# Patient Record
Sex: Female | Born: 1990 | Race: Black or African American | Hispanic: No | Marital: Single | Smoking: Never smoker
Health system: Southern US, Community
[De-identification: ages and names within clinical notes are randomized; demographics above are authoritative.]

## PROBLEM LIST (undated history)

## (undated) DIAGNOSIS — Z789 Other specified health status: Secondary | ICD-10-CM

## (undated) HISTORY — PX: NO PAST SURGERIES: SHX2092

---

## 2008-01-08 ENCOUNTER — Inpatient Hospital Stay (HOSPITAL_COMMUNITY): Admission: AD | Admit: 2008-01-08 | Discharge: 2008-01-08 | Payer: Self-pay | Admitting: Family Medicine

## 2008-01-10 ENCOUNTER — Inpatient Hospital Stay (HOSPITAL_COMMUNITY): Admission: AD | Admit: 2008-01-10 | Discharge: 2008-01-10 | Payer: Self-pay | Admitting: Gynecology

## 2008-01-17 ENCOUNTER — Inpatient Hospital Stay (HOSPITAL_COMMUNITY): Admission: AD | Admit: 2008-01-17 | Discharge: 2008-01-17 | Payer: Self-pay | Admitting: Obstetrics and Gynecology

## 2008-01-27 ENCOUNTER — Inpatient Hospital Stay (HOSPITAL_COMMUNITY): Admission: AD | Admit: 2008-01-27 | Discharge: 2008-01-27 | Payer: Self-pay | Admitting: Obstetrics and Gynecology

## 2010-02-28 IMAGING — US US OB COMP LESS 14 WK
1 series · 14 of 28 positions shown · non-contrast
Comparison: none

CLINICAL DATA: Positive pregnancy test with pelvic pain and vaginal
bleeding.

OBSTETRIC <14 WK US AND TRANSVAGINAL OB US
TECHNIQUE: Both transabdominal and transvaginal ultrasound
examinations were performed for complete evaluation of the
gestation as well as the maternal uterus, adnexal regions, and
pelvic cul-de-sac.

[Series 1: us ob comp less 14 wks · 14 of 43 slices shown]
[im 2/43]
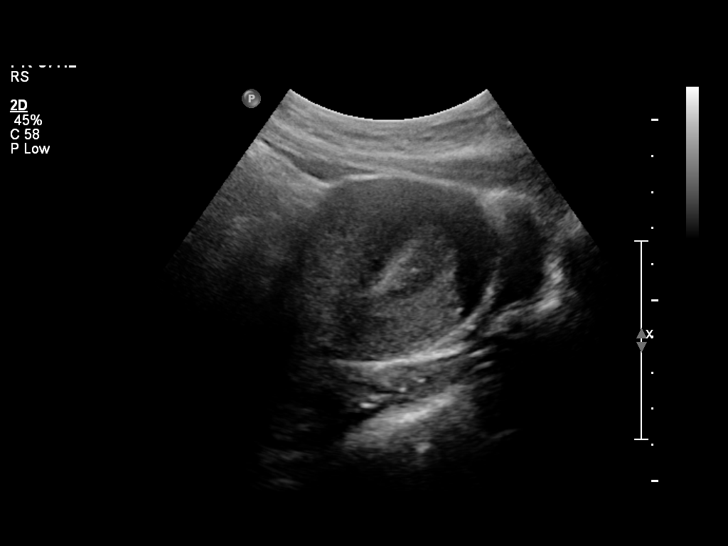
[im 5/43]
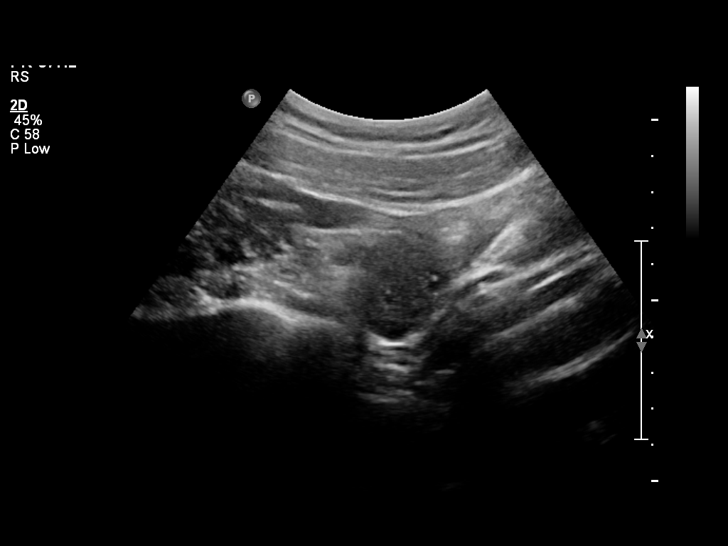
[im 8/43]
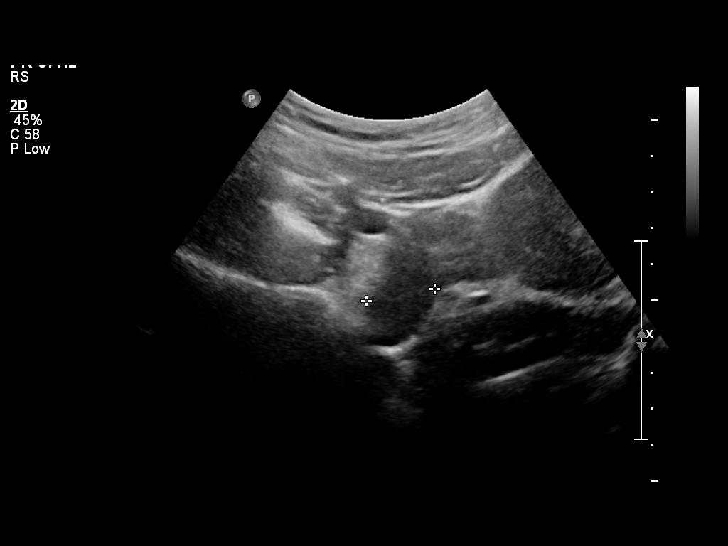
[im 11/43]
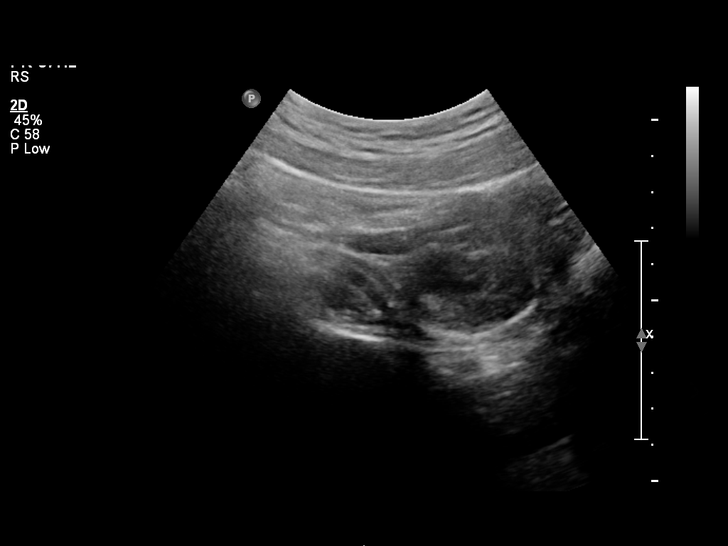
[im 15/43]
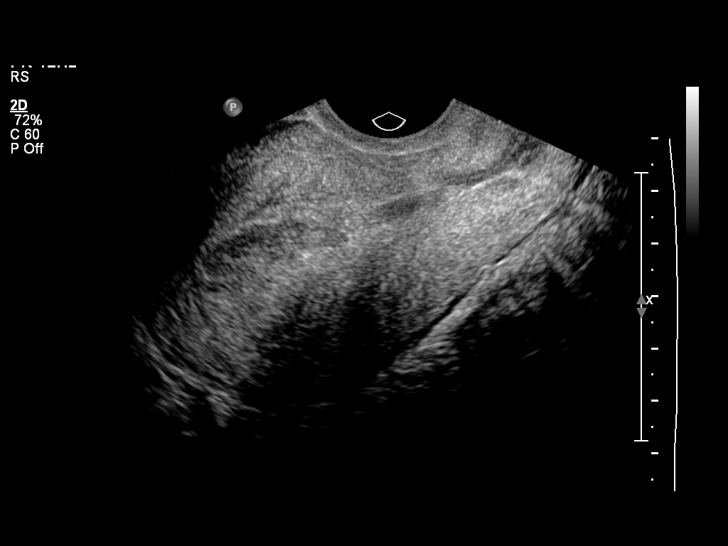
[im 18/43]
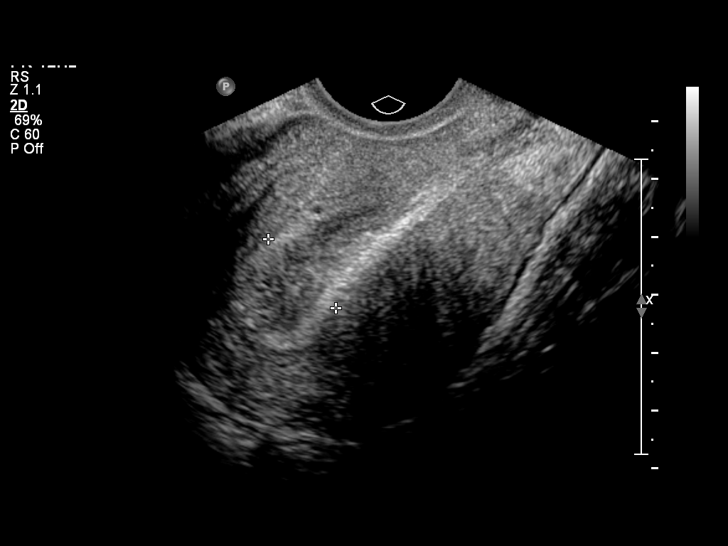
[im 21/43]
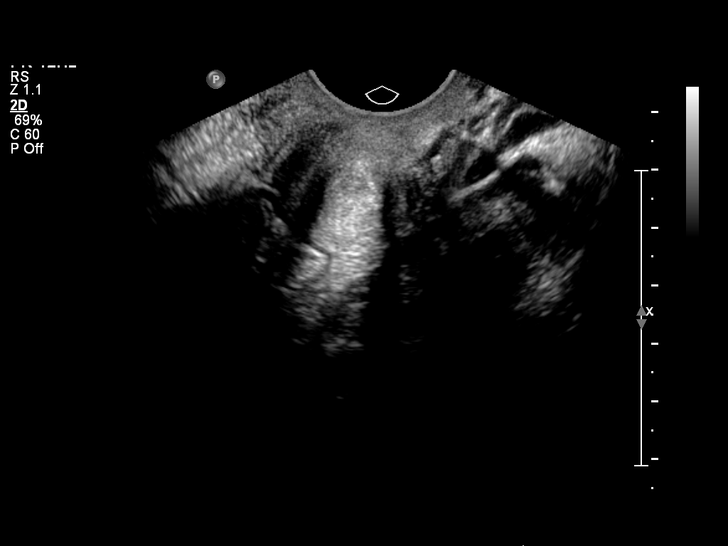
[im 24/43]
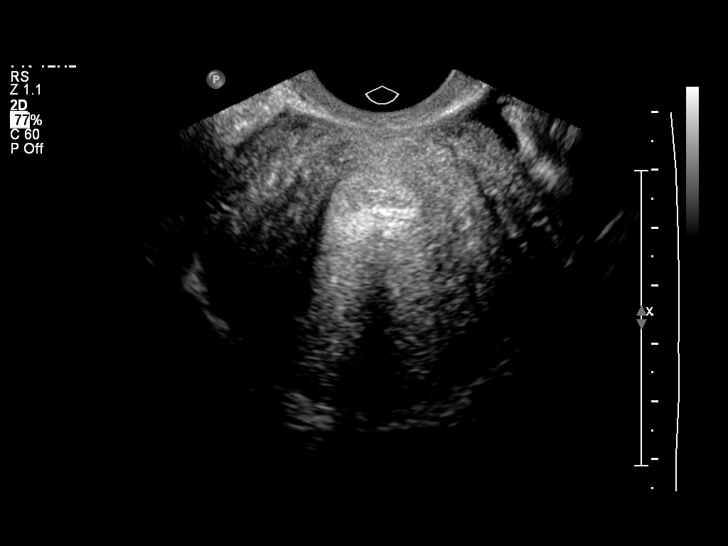
[im 27/43]
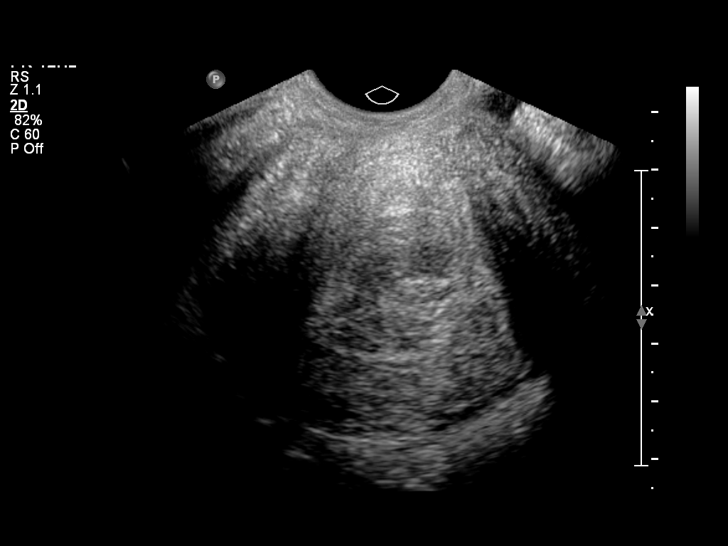
[im 30/43]
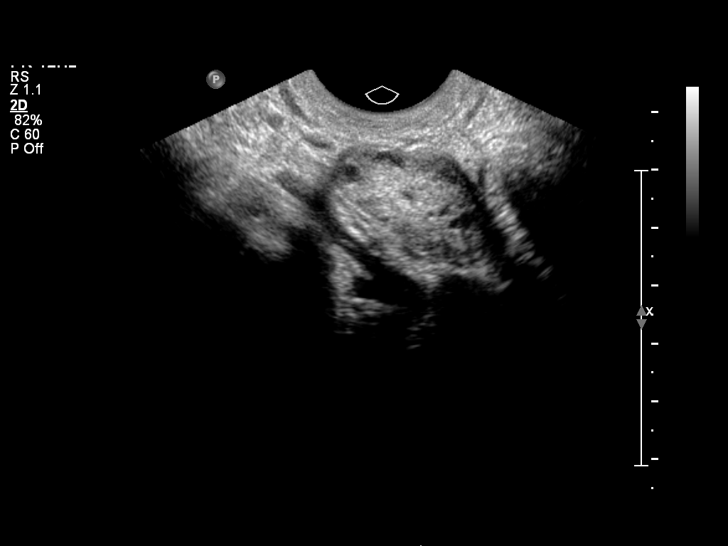
[im 33/43]
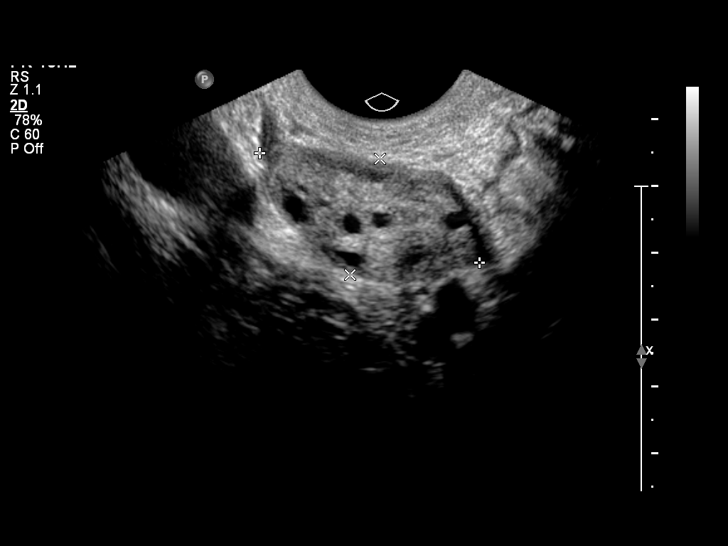
[im 36/43]
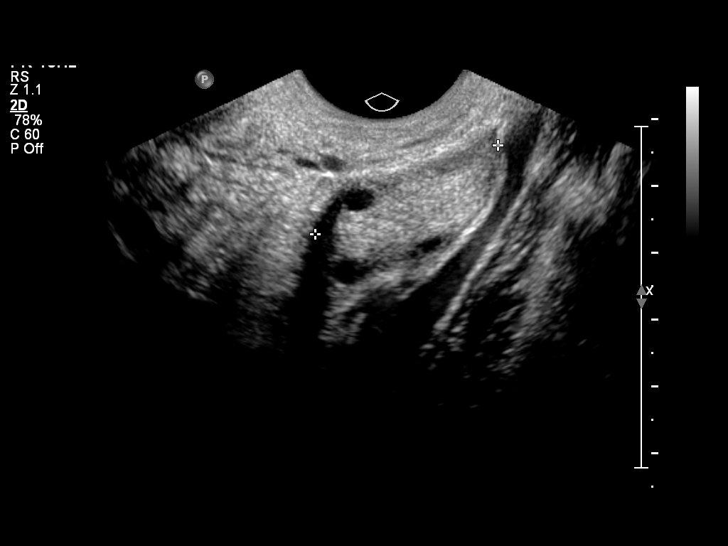
[im 39/43]
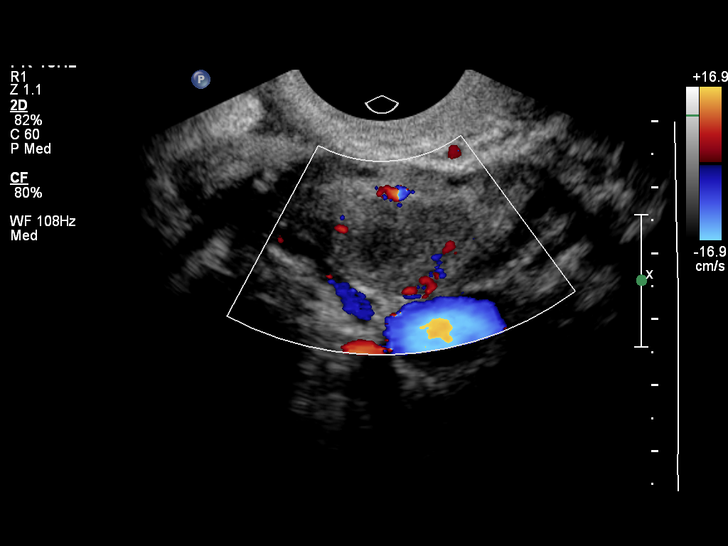
[im 43/43]
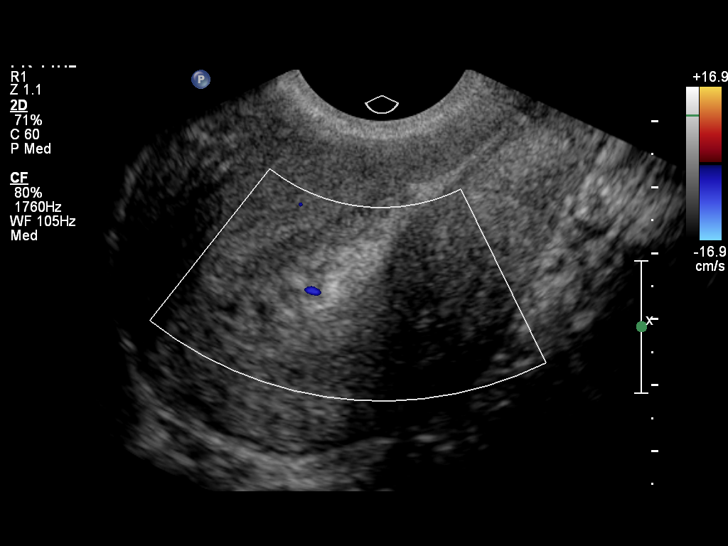

[14 of 28 positions shown; findings below may reference images not displayed]

Intrauterine gestational sac: No
Yolk sac: No
Embryo: No
Cardiac Activity: No

The endometrium is heterogeneously thickened - measuring up to 19
mm in greatest diameter.

The uterus is anteverted measuring 9 x 5.6 x 6.8 cm.
The ovaries bilaterally are unremarkable except for probable left
corpus luteum.
No adnexal masses or free fluid identified.
IMPRESSION: No evidence of intrauterine pregnancy.

Heterogeneously thickened endometrium - may represent products of
conception but clinical or ultrasound follow-up is recommended.

Normal ovaries.  No evidence of adnexal mass or free fluid.

## 2010-12-01 ENCOUNTER — Emergency Department (HOSPITAL_COMMUNITY)
Admission: EM | Admit: 2010-12-01 | Discharge: 2010-12-01 | Disposition: A | Discharge status: Home / Self Care | Payer: BC Managed Care – PPO | Attending: Emergency Medicine | Admitting: Emergency Medicine

## 2010-12-01 DIAGNOSIS — K649 Unspecified hemorrhoids: Secondary | ICD-10-CM | POA: Insufficient documentation

## 2010-12-01 LAB — OCCULT BLOOD, POC DEVICE: Fecal Occult Bld: POSITIVE

## 2011-02-19 ENCOUNTER — Emergency Department: Payer: Self-pay | Admitting: Emergency Medicine

## 2011-07-10 LAB — URINALYSIS, ROUTINE W REFLEX MICROSCOPIC
Glucose, UA: NEGATIVE
Leukocytes, UA: NEGATIVE
Protein, ur: NEGATIVE
pH: 7

## 2011-07-10 LAB — WET PREP, GENITAL: Clue Cells Wet Prep HPF POC: NONE SEEN

## 2011-07-10 LAB — URINE MICROSCOPIC-ADD ON

## 2011-07-10 LAB — CBC
HCT: 36.5
MCHC: 34.8
Platelets: 376
RDW: 14.1

## 2011-07-10 LAB — HCG, QUANTITATIVE, PREGNANCY: hCG, Beta Chain, Quant, S: 168 — ABNORMAL HIGH

## 2011-07-10 LAB — GC/CHLAMYDIA PROBE AMP, GENITAL: GC Probe Amp, Genital: NEGATIVE

## 2011-07-11 LAB — POCT PREGNANCY, URINE: Preg Test, Ur: NEGATIVE

## 2011-07-11 LAB — HCG, QUANTITATIVE, PREGNANCY: hCG, Beta Chain, Quant, S: 7 — ABNORMAL HIGH

## 2011-10-24 ENCOUNTER — Emergency Department (HOSPITAL_COMMUNITY)
Admission: EM | Admit: 2011-10-24 | Discharge: 2011-10-24 | Disposition: A | Discharge status: Home / Self Care | Payer: BC Managed Care – PPO | Attending: Emergency Medicine | Admitting: Emergency Medicine

## 2011-10-24 ENCOUNTER — Encounter: Payer: Self-pay | Admitting: *Deleted

## 2011-10-24 DIAGNOSIS — N898 Other specified noninflammatory disorders of vagina: Secondary | ICD-10-CM | POA: Insufficient documentation

## 2011-10-24 DIAGNOSIS — S3763XA Laceration of uterus, initial encounter: Secondary | ICD-10-CM

## 2011-10-24 DIAGNOSIS — S3760XA Unspecified injury of uterus, initial encounter: Secondary | ICD-10-CM | POA: Insufficient documentation

## 2011-10-24 DIAGNOSIS — IMO0002 Reserved for concepts with insufficient information to code with codable children: Secondary | ICD-10-CM | POA: Insufficient documentation

## 2011-10-24 DIAGNOSIS — N939 Abnormal uterine and vaginal bleeding, unspecified: Secondary | ICD-10-CM

## 2011-10-24 LAB — URINALYSIS, ROUTINE W REFLEX MICROSCOPIC
Bilirubin Urine: NEGATIVE
Ketones, ur: NEGATIVE mg/dL
Protein, ur: NEGATIVE mg/dL
Urobilinogen, UA: 0.2 mg/dL (ref 0.0–1.0)

## 2011-10-24 LAB — URINE MICROSCOPIC-ADD ON

## 2011-10-24 LAB — WET PREP, GENITAL
Trich, Wet Prep: NONE SEEN
Yeast Wet Prep HPF POC: NONE SEEN

## 2011-10-24 NOTE — Discharge Instructions (Signed)

## 2011-10-24 NOTE — ED Provider Notes (Signed)
History     CSN: [id number]  Arrival date & time 10/24/11  1212   First MD Initiated Contact with Patient 10/24/11 1913      Chief Complaint  Patient presents with  . Vaginal Bleeding    pt reports vaginal bleeding after intercourse. denies vaginal discharge or odor. reports normal period in December 12th and abnormal spotting with intercourse since then.     7:59 PM HPI Pt reports vaginal bleeding after intercourse this evening. Denies pain. Still in December similar event occurred. Denies abdominal pain, N/V, vaginal discharge, vaginal pain, n/v, back pain, urinary symptoms, weakness, fatigue or dizziness. Reports last normal menses was in November. Denies h/o abnormal menses. Reports only bleeding a small amount of blood, less then a period.  Patient is a 21 y.o. female presenting with vaginal bleeding.  Vaginal Bleeding This is a new problem. The current episode started today. The problem occurs constantly. The problem has been unchanged. Pertinent negatives include no abdominal pain, chest pain, chills, fever, headaches, nausea, urinary symptoms, vomiting or weakness. The symptoms are aggravated by nothing. She has tried nothing for the symptoms.    History reviewed. No pertinent past medical history.  History reviewed. No pertinent past surgical history.  History reviewed. No pertinent family history.  History  Substance Use Topics  . Smoking status: Never Smoker   . Smokeless tobacco: Not on file  . Alcohol Use: No    OB History    Grav Para Term Preterm Abortions TAB SAB Ect Mult Living                  Review of Systems  Constitutional: Negative for fever and chills.  Respiratory: Negative for shortness of breath.   Cardiovascular: Negative for chest pain.  Gastrointestinal: Negative for nausea, vomiting, abdominal pain, diarrhea and constipation.  Genitourinary: Positive for vaginal bleeding. Negative for dysuria, urgency, frequency, hematuria, flank pain,  vaginal discharge and vaginal pain.  Musculoskeletal: Negative for back pain.  Neurological: Negative for weakness and headaches.  All other systems reviewed and are negative.    Allergies  Review of patient's allergies indicates no known allergies.  Home Medications  No current outpatient prescriptions on file.  BP 111/70  Pulse 78  Temp(Src) 98.9 F (37.2 C) (Oral)  Resp 20  Wt 170 lb (77.111 kg)  SpO2 100%  LMP 09/27/2011  Physical Exam  Vitals reviewed. Constitutional: She is oriented to person, place, and time. Vital signs are normal. She appears well-developed and well-nourished.  HENT:  Head: Normocephalic and atraumatic.  Eyes: Conjunctivae are normal. Pupils are equal, round, and reactive to light.  Neck: Normal range of motion. Neck supple.  Cardiovascular: Normal rate, regular rhythm and normal heart sounds.  Exam reveals no friction rub.   No murmur heard. Pulmonary/Chest: Effort normal and breath sounds normal. She has no wheezes. She has no rhonchi. She has no rales. She exhibits no tenderness.  Abdominal: Soft. Bowel sounds are normal. She exhibits no distension. There is no tenderness. There is no rebound and no guarding.  Genitourinary: Uterus normal. There is no tenderness, lesion or injury on the right labia. There is no tenderness, lesion or injury on the left labia. Cervix exhibits no motion tenderness and no discharge. Right adnexum displays no mass, no tenderness and no fullness. Left adnexum displays no mass, no tenderness and no fullness. No bleeding (blood in vaginal vault) around the vagina. No signs of injury around the vagina. No vaginal discharge found.  Musculoskeletal: Normal range of motion.  Neurological: She is alert and oriented to person, place, and time. Coordination normal.  Skin: Skin is warm and dry. No rash noted. No erythema. No pallor.    ED Course  Procedures  Results for orders placed during the hospital encounter of 10/24/11    URINALYSIS, ROUTINE W REFLEX MICROSCOPIC      Component Value Range   Color, Urine YELLOW  YELLOW    APPearance CLEAR  CLEAR    Specific Gravity, Urine 1.021  1.005 - 1.030    pH 6.5  5.0 - 8.0    Glucose, UA NEGATIVE  NEGATIVE (mg/dL)   Hgb urine dipstick LARGE (*) NEGATIVE    Bilirubin Urine NEGATIVE  NEGATIVE    Ketones, ur NEGATIVE  NEGATIVE (mg/dL)   Protein, ur NEGATIVE  NEGATIVE (mg/dL)   Urobilinogen, UA 0.2  0.0 - 1.0 (mg/dL)   Nitrite NEGATIVE  NEGATIVE    Leukocytes, UA NEGATIVE  NEGATIVE   POCT PREGNANCY, URINE      Component Value Range   Preg Test, Ur NEGATIVE    URINE MICROSCOPIC-ADD ON      Component Value Range   Squamous Epithelial / LPF FEW (*) RARE    RBC / HPF 11-20  <3 (RBC/hpf)   Bacteria, UA FEW (*) RARE    Urine-Other MUCOUS PRESENT    WET PREP, GENITAL      Component Value Range   Yeast, Wet Prep NONE SEEN  NONE SEEN    Trich, Wet Prep NONE SEEN  NONE SEEN    Clue Cells, Wet Prep MODERATE (*) NONE SEEN    WBC, Wet Prep HPF POC FEW (*) NONE SEEN    No results found.   MDM  D/c patient with diagnosis of abnormal vaginal bleeding and cervical laceration. Low suspicion that abnormal bleeding is due to laceration since lac was very small (<1cm). Patient does not reports heacy bleeding, weakness, dizziness or fatigue so I did not wait for Isat chem 8. Pt states she will f/u with her OB, Dr. Waynard Reeds for further evaluation. Advised return to ED for worsening symptoms. Also advised patient to be on pelvic rest for 1 week or until advised otherwise by OB/GYN.      Thomasene Lot, PA-C 10/24/11 2316

## 2011-10-25 NOTE — ED Provider Notes (Signed)
Medical screening examination/treatment/procedure(s) were performed by non-physician practitioner and as supervising physician I was immediately available for consultation/collaboration.  Severino Paolo, MD 10/25/11 1241 

## 2015-08-09 LAB — OB RESULTS CONSOLE GC/CHLAMYDIA
CHLAMYDIA, DNA PROBE: NEGATIVE
Gonorrhea: NEGATIVE

## 2015-09-20 LAB — OB RESULTS CONSOLE RUBELLA ANTIBODY, IGM: Rubella: IMMUNE

## 2015-09-20 LAB — OB RESULTS CONSOLE HIV ANTIBODY (ROUTINE TESTING): HIV: NONREACTIVE

## 2015-09-20 LAB — OB RESULTS CONSOLE HEPATITIS B SURFACE ANTIGEN: Hepatitis B Surface Ag: NEGATIVE

## 2015-09-20 LAB — OB RESULTS CONSOLE ABO/RH: RH TYPE: POSITIVE

## 2015-09-20 LAB — OB RESULTS CONSOLE ANTIBODY SCREEN: ANTIBODY SCREEN: NEGATIVE

## 2015-09-20 LAB — OB RESULTS CONSOLE RPR: RPR: NONREACTIVE

## 2015-10-17 NOTE — L&D Delivery Note (Signed)
Patient was C/C/0 and pushed for7 minutes with epidural.   NSVD  female infant, Apgars 9/9, weight pending.   The patient had a left periurethral and left posterior vaginal mucosal tear repaired with 3.0 vicryl. Fundus was firm. EBL was expected amount. Placenta was delivered intact. Vagina was clear.  Baby was vigorous and doing skin to skin with mother.  Philip AspenALLAHAN, Gearl Kimbrough

## 2016-02-29 LAB — OB RESULTS CONSOLE GBS: STREP GROUP B AG: POSITIVE

## 2016-03-27 ENCOUNTER — Encounter (HOSPITAL_COMMUNITY): Payer: Self-pay

## 2016-03-27 ENCOUNTER — Inpatient Hospital Stay (HOSPITAL_COMMUNITY): Payer: 59 | Admitting: Anesthesiology

## 2016-03-27 ENCOUNTER — Inpatient Hospital Stay (HOSPITAL_COMMUNITY)
Admission: AD | Admit: 2016-03-27 | Discharge: 2016-03-29 | DRG: 775 | Disposition: A | Discharge status: Home / Self Care | Payer: 59 | Source: Ambulatory Visit | Attending: Obstetrics and Gynecology | Admitting: Obstetrics and Gynecology

## 2016-03-27 DIAGNOSIS — O99824 Streptococcus B carrier state complicating childbirth: Principal | ICD-10-CM | POA: Diagnosis present

## 2016-03-27 DIAGNOSIS — Z3A39 39 weeks gestation of pregnancy: Secondary | ICD-10-CM | POA: Diagnosis not present

## 2016-03-27 DIAGNOSIS — O4290 Premature rupture of membranes, unspecified as to length of time between rupture and onset of labor, unspecified weeks of gestation: Secondary | ICD-10-CM | POA: Diagnosis present

## 2016-03-27 HISTORY — DX: Other specified health status: Z78.9

## 2016-03-27 LAB — CBC
HEMATOCRIT: 32 % — AB (ref 36.0–46.0)
HEMOGLOBIN: 11.2 g/dL — AB (ref 12.0–15.0)
MCH: 31.9 pg (ref 26.0–34.0)
MCHC: 35 g/dL (ref 30.0–36.0)
MCV: 91.2 fL (ref 78.0–100.0)
Platelets: 290 10*3/uL (ref 150–400)
RBC: 3.51 MIL/uL — ABNORMAL LOW (ref 3.87–5.11)
RDW: 14.6 % (ref 11.5–15.5)
WBC: 7.7 10*3/uL (ref 4.0–10.5)

## 2016-03-27 LAB — TYPE AND SCREEN
ABO/RH(D): O POS
Antibody Screen: NEGATIVE

## 2016-03-27 LAB — RPR: RPR Ser Ql: NONREACTIVE

## 2016-03-27 LAB — POCT FERN TEST: POCT Fern Test: POSITIVE

## 2016-03-27 MED ORDER — PHENYLEPHRINE 40 MCG/ML (10ML) SYRINGE FOR IV PUSH (FOR BLOOD PRESSURE SUPPORT)
80.0000 ug | PREFILLED_SYRINGE | INTRAVENOUS | Status: DC | PRN
  Filled 2016-03-27: qty 5
  Filled 2016-03-27: qty 10

## 2016-03-27 MED ORDER — LACTATED RINGERS IV SOLN: 500.0000 mL | INTRAVENOUS | Status: DC | PRN

## 2016-03-27 MED ORDER — LACTATED RINGERS IV SOLN: 500.0000 mL | Freq: Once | INTRAVENOUS | Status: DC

## 2016-03-27 MED ORDER — OXYTOCIN BOLUS FROM INFUSION
500.0000 mL | INTRAVENOUS | Status: DC
  Administered 2016-03-27: 500 mL via INTRAVENOUS

## 2016-03-27 MED ORDER — IBUPROFEN 600 MG PO TABS
600.0000 mg | ORAL_TABLET | Freq: Four times a day (QID) | ORAL | Status: DC
  Administered 2016-03-27 – 2016-03-29 (×8): 600 mg via ORAL
  Filled 2016-03-27 (×8): qty 1

## 2016-03-27 MED ORDER — COCONUT OIL OIL: 1.0000 "application " | TOPICAL_OIL | Status: DC | PRN

## 2016-03-27 MED ORDER — ONDANSETRON HCL 4 MG/2ML IJ SOLN: 4.0000 mg | INTRAMUSCULAR | Status: DC | PRN

## 2016-03-27 MED ORDER — SOD CITRATE-CITRIC ACID 500-334 MG/5ML PO SOLN: 30.0000 mL | ORAL | Status: DC | PRN

## 2016-03-27 MED ORDER — PENICILLIN G POTASSIUM 5000000 UNITS IJ SOLR
5.0000 10*6.[IU] | Freq: Once | INTRAVENOUS | Status: AC
  Administered 2016-03-27: 5 10*6.[IU] via INTRAVENOUS
  Filled 2016-03-27: qty 5

## 2016-03-27 MED ORDER — OXYCODONE-ACETAMINOPHEN 5-325 MG PO TABS: 1.0000 | ORAL_TABLET | ORAL | Status: DC | PRN

## 2016-03-27 MED ORDER — FENTANYL 2.5 MCG/ML BUPIVACAINE 1/10 % EPIDURAL INFUSION (WH - ANES)
14.0000 mL/h | INTRAMUSCULAR | Status: DC | PRN
  Administered 2016-03-27: 14 mL/h via EPIDURAL
  Filled 2016-03-27: qty 125

## 2016-03-27 MED ORDER — TETANUS-DIPHTH-ACELL PERTUSSIS 5-2.5-18.5 LF-MCG/0.5 IM SUSP: 0.5000 mL | Freq: Once | INTRAMUSCULAR | Status: DC

## 2016-03-27 MED ORDER — LIDOCAINE HCL (PF) 1 % IJ SOLN
INTRAMUSCULAR | Status: DC | PRN
  Administered 2016-03-27 (×2): 4 mL via EPIDURAL

## 2016-03-27 MED ORDER — WITCH HAZEL-GLYCERIN EX PADS: 1.0000 "application " | MEDICATED_PAD | CUTANEOUS | Status: DC | PRN

## 2016-03-27 MED ORDER — EPHEDRINE 5 MG/ML INJ
10.0000 mg | INTRAVENOUS | Status: DC | PRN
  Filled 2016-03-27: qty 2

## 2016-03-27 MED ORDER — DIPHENHYDRAMINE HCL 25 MG PO CAPS: 25.0000 mg | ORAL_CAPSULE | Freq: Four times a day (QID) | ORAL | Status: DC | PRN

## 2016-03-27 MED ORDER — FLEET ENEMA 7-19 GM/118ML RE ENEM: 1.0000 | ENEMA | RECTAL | Status: DC | PRN

## 2016-03-27 MED ORDER — OXYTOCIN 40 UNITS IN LACTATED RINGERS INFUSION - SIMPLE MED
1.0000 m[IU]/min | INTRAVENOUS | Status: DC
  Administered 2016-03-27: 2 m[IU]/min via INTRAVENOUS

## 2016-03-27 MED ORDER — PRENATAL MULTIVITAMIN CH
1.0000 | ORAL_TABLET | Freq: Every day | ORAL | Status: DC
  Administered 2016-03-28 – 2016-03-29 (×2): 1 via ORAL
  Filled 2016-03-27 (×2): qty 1

## 2016-03-27 MED ORDER — SIMETHICONE 80 MG PO CHEW: 80.0000 mg | CHEWABLE_TABLET | ORAL | Status: DC | PRN

## 2016-03-27 MED ORDER — ACETAMINOPHEN 325 MG PO TABS: 650.0000 mg | ORAL_TABLET | ORAL | Status: DC | PRN

## 2016-03-27 MED ORDER — LACTATED RINGERS IV SOLN
INTRAVENOUS | Status: DC
  Administered 2016-03-27 (×2): via INTRAVENOUS

## 2016-03-27 MED ORDER — PHENYLEPHRINE 40 MCG/ML (10ML) SYRINGE FOR IV PUSH (FOR BLOOD PRESSURE SUPPORT)
80.0000 ug | PREFILLED_SYRINGE | INTRAVENOUS | Status: DC | PRN
  Filled 2016-03-27: qty 5

## 2016-03-27 MED ORDER — DIBUCAINE 1 % RE OINT: 1.0000 "application " | TOPICAL_OINTMENT | RECTAL | Status: DC | PRN

## 2016-03-27 MED ORDER — ONDANSETRON HCL 4 MG PO TABS: 4.0000 mg | ORAL_TABLET | ORAL | Status: DC | PRN

## 2016-03-27 MED ORDER — BUTORPHANOL TARTRATE 1 MG/ML IJ SOLN
1.0000 mg | INTRAMUSCULAR | Status: DC | PRN
Start: 2016-03-27 — End: 2016-03-27
  Administered 2016-03-27 (×2): 1 mg via INTRAVENOUS
  Filled 2016-03-27 (×2): qty 1

## 2016-03-27 MED ORDER — ONDANSETRON HCL 4 MG/2ML IJ SOLN: 4.0000 mg | Freq: Four times a day (QID) | INTRAMUSCULAR | Status: DC | PRN

## 2016-03-27 MED ORDER — OXYTOCIN 40 UNITS IN LACTATED RINGERS INFUSION - SIMPLE MED
2.5000 [IU]/h | INTRAVENOUS | Status: DC
  Filled 2016-03-27: qty 1000

## 2016-03-27 MED ORDER — PENICILLIN G POTASSIUM 5000000 UNITS IJ SOLR
2.5000 10*6.[IU] | INTRAVENOUS | Status: DC
  Administered 2016-03-27 (×2): 2.5 10*6.[IU] via INTRAVENOUS
  Filled 2016-03-27 (×7): qty 2.5

## 2016-03-27 MED ORDER — LIDOCAINE HCL (PF) 1 % IJ SOLN
30.0000 mL | INTRAMUSCULAR | Status: DC | PRN
  Filled 2016-03-27: qty 30

## 2016-03-27 MED ORDER — DIPHENHYDRAMINE HCL 50 MG/ML IJ SOLN: 12.5000 mg | INTRAMUSCULAR | Status: DC | PRN

## 2016-03-27 MED ORDER — OXYCODONE-ACETAMINOPHEN 5-325 MG PO TABS: 2.0000 | ORAL_TABLET | ORAL | Status: DC | PRN

## 2016-03-27 MED ORDER — ZOLPIDEM TARTRATE 5 MG PO TABS: 5.0000 mg | ORAL_TABLET | Freq: Every evening | ORAL | Status: DC | PRN

## 2016-03-27 MED ORDER — BENZOCAINE-MENTHOL 20-0.5 % EX AERO: 1.0000 "application " | INHALATION_SPRAY | CUTANEOUS | Status: DC | PRN

## 2016-03-27 MED ORDER — TERBUTALINE SULFATE 1 MG/ML IJ SOLN
0.2500 mg | Freq: Once | INTRAMUSCULAR | Status: DC | PRN
  Filled 2016-03-27: qty 1

## 2016-03-27 MED ORDER — SENNOSIDES-DOCUSATE SODIUM 8.6-50 MG PO TABS
2.0000 | ORAL_TABLET | ORAL | Status: DC
  Administered 2016-03-27 – 2016-03-29 (×2): 2 via ORAL
  Filled 2016-03-27 (×2): qty 2

## 2016-03-27 NOTE — Anesthesia Preprocedure Evaluation (Signed)
Anesthesia Evaluation  Patient identified by MRN, date of birth, ID band Patient awake    Reviewed: Allergy & Precautions, H&P , Patient's Chart, lab work & pertinent test results  Airway Mallampati: II  TM Distance: >3 FB Neck ROM: full    Dental no notable dental hx. (+) Teeth Intact   Pulmonary neg pulmonary ROS,    Pulmonary exam normal breath sounds clear to auscultation       Cardiovascular negative cardio ROS Normal cardiovascular exam Rhythm:regular Rate:Normal     Neuro/Psych negative neurological ROS  negative psych ROS   GI/Hepatic negative GI ROS, Neg liver ROS,   Endo/Other  Obesity  Renal/GU negative Renal ROS  negative genitourinary   Musculoskeletal   Abdominal   Peds  Hematology  (+) anemia ,   Anesthesia Other Findings   Reproductive/Obstetrics (+) Pregnancy                             Anesthesia Physical Anesthesia Plan  ASA: II  Anesthesia Plan: Epidural   Post-op Pain Management:    Induction:   Airway Management Planned:   Additional Equipment:   Intra-op Plan:   Post-operative Plan:   Informed Consent: I have reviewed the patients History and Physical, chart, labs and discussed the procedure including the risks, benefits and alternatives for the proposed anesthesia with the patient or authorized representative who has indicated his/her understanding and acceptance.     Plan Discussed with: Anesthesiologist  Anesthesia Plan Comments:         Anesthesia Quick Evaluation  

## 2016-03-27 NOTE — MAU Note (Signed)
Pt presents complaining of SROM at 0140 and contractions every 3-4 minutes. Reports good fetal movement. Some pink tinged fluid

## 2016-03-27 NOTE — Progress Notes (Signed)
Tracing maternal HR at moments

## 2016-03-27 NOTE — Anesthesia Postprocedure Evaluation (Signed)
Anesthesia Post Note  Patient: Erin Poole  Procedure(s) Performed: * No procedures listed *  Patient location during evaluation: Mother Baby Anesthesia Type: Epidural Level of consciousness: awake and alert Pain management: pain level controlled Vital Signs Assessment: post-procedure vital signs reviewed and stable Respiratory status: spontaneous breathing Cardiovascular status: blood pressure returned to baseline and stable Postop Assessment: no headache, adequate PO intake, no backache, patient able to bend at knees, epidural receding and no signs of nausea or vomiting Anesthetic complications: no     Last Vitals:  Filed Vitals:   03/27/16 1600 03/27/16 1700  BP: 105/78 116/73  Pulse: 97 105  Temp: 36.6 C 37.3 C  Resp: 18 20    Last Pain:  Filed Vitals:   03/27/16 1818  PainSc: 0-No pain   Pain Goal: Patients Stated Pain Goal: 2 (03/27/16 0628)               Erin Poole, Massie Cogliano Marie

## 2016-03-27 NOTE — Lactation Note (Signed)
This note was copied from a baby's chart. Lactation Consultation Note Initial visit at 8 hours of age.  Mom reports giving a bottle to baby because she knew he was hungry.  Mom reports baby is not latching well to breast, "he wont stay on."  Lc offered to assist and mom thinks she wants to pump and bottle feed.  FOB reports baby has had bottle and paci and he isn't sure baby will latch now.  Mom has a DEBP for home use.  Tech is at bedside to do hearing screening.  Lc advised mom to call Rn for DEBP set up if she want to pump this evening.  LC advised mom to start now to establish a good milk supply.  LC reported to RN, Alyssa.  LC discussed supply with pumping and that she may need to continue to give formula until her milk volume increases.  Advised mom to pump every 3 hours or 8x/24 hours on setting for 15 minutes. [facility] resources given and discussed.  Encouraged to feed with early cues on demand.  Early newborn behavior discussed. Mom to call for assist as needed.      Patient Name: Erin Poole ZOXWR'UToday's Date: 03/27/2016 Reason for consult: Initial assessment   Maternal Data Has patient been taught Hand Expression?: Yes Does the patient have breastfeeding experience prior to this delivery?: No  Feeding Feeding Type: Formula Length of feed:  (10 min attempt a few sucks per mom)  LATCH Score/Interventions Latch: Repeated attempts needed to sustain latch, nipple held in mouth throughout feeding, stimulation needed to elicit sucking reflex. Intervention(s): Adjust position;Assist with latch;Breast compression  Audible Swallowing: A few with stimulation Intervention(s): Skin to skin;Hand expression  Type of Nipple: Flat  Comfort (Breast/Nipple): Soft / non-tender     Hold (Positioning): Assistance needed to correctly position infant at breast and maintain latch. Intervention(s): Breastfeeding basics reviewed  LATCH Score: 6  Lactation Tools Discussed/Used     Consult  Status Consult Status: Follow-up Date: 03/28/16 Follow-up type: In-patient    Jannifer RodneyShoptaw, Jana Lynn 03/27/2016, 10:48 PM

## 2016-03-27 NOTE — Anesthesia Procedure Notes (Signed)
Epidural Patient location during procedure: OB Start time: 03/27/2016 9:40 AM  Staffing Anesthesiologist: Mal AmabileFOSTER, Rodric Punch Performed by: anesthesiologist   Preanesthetic Checklist Completed: patient identified, site marked, surgical consent, pre-op evaluation, timeout performed, IV checked, risks and benefits discussed and monitors and equipment checked  Epidural Patient position: sitting Prep: site prepped and draped and DuraPrep Patient monitoring: continuous pulse ox and blood pressure Approach: midline Location: L3-L4 Injection technique: LOR air  Needle:  Needle type: Tuohy  Needle gauge: 17 G Needle length: 9 cm and 9 Needle insertion depth: 6 cm Catheter type: closed end flexible Catheter size: 19 Gauge Catheter at skin depth: 12 cm Test dose: negative and Other  Assessment Events: blood not aspirated, injection not painful, no injection resistance, negative IV test and no paresthesia  Additional Notes Patient identified. Risks and benefits discussed including failed block, incomplete  Pain control, post dural puncture headache, nerve damage, paralysis, blood pressure Changes, nausea, vomiting, reactions to medications-both toxic and allergic and post Partum back pain. All questions were answered. Patient expressed understanding and wished to proceed. Sterile technique was used throughout procedure. Epidural site was Dressed with sterile barrier dressing. No paresthesias, signs of intravascular injection Or signs of intrathecal spread were encountered.  Patient was more comfortable after the epidural was dosed. Please see RN's note for documentation of vital signs and FHR which are stable.

## 2016-03-27 NOTE — H&P (Addendum)
25 y.o. 551w3d  G2P0010 comes in c/o LOF around 1am, feeling contractions.  Otherwise has good fetal movement and no bleeding.  Past Medical History  Diagnosis Date  . Medical history non-contributory     Past Surgical History  Procedure Laterality Date  . No past surgeries      OB History  Gravida Para Term Preterm AB SAB TAB Ectopic Multiple Living  2    1 1         # Outcome Date GA Lbr Len/2nd Weight Sex Delivery Anes PTL Lv  2 Current           1 SAB               Social History   Social History  . Marital Status: Single    Spouse Name: N/A  . Number of Children: N/A  . Years of Education: N/A   Occupational History  . Not on file.   Social History Main Topics  . Smoking status: Never Smoker   . Smokeless tobacco: Never Used  . Alcohol Use: No  . Drug Use: No  . Sexual Activity: Not on file   Other Topics Concern  . Not on file   Social History Narrative   Review of patient's allergies indicates no known allergies.    Prenatal Transfer Tool  Maternal Diabetes: No Genetic Screening: Normal Maternal Ultrasounds/Referrals: Normal Fetal Ultrasounds or other Referrals:  None Maternal Substance Abuse:  No Significant Maternal Medications:  None Significant Maternal Lab Results: Lab values include: Group B Strep positive  Other PNC: obesity, fibroids noted in history, last two US undefined.    Filed Vitals:   03/27/16 0331 03/27/16 0623  BP: 124/82 118/68  Pulse: 97 95  Temp: 98.5 F (36.9 C) 98.3 F (36.8 C)  Resp: 16 16     Lungs/Cor:  NAD Abdomen:  soft, gravid Ex:  no cords, erythema SVE:  1/70/-3 FHTs:  135 moderate variability, 10X10s Toco:  2-4   A/P  Pt admitted to L&D by Dr. Tenny Crawoss overnight  GBS Pos - PCN started  Pt rechecked at 6:15- no change - advised RN to start pitocin 2x2  Epidural when desired  Other routine care  HobartALLAHAN, Luther ParodySIDNEY

## 2016-03-28 LAB — CBC
HEMATOCRIT: 29.3 % — AB (ref 36.0–46.0)
Hemoglobin: 10.2 g/dL — ABNORMAL LOW (ref 12.0–15.0)
MCH: 32 pg (ref 26.0–34.0)
MCHC: 34.8 g/dL (ref 30.0–36.0)
MCV: 91.8 fL (ref 78.0–100.0)
Platelets: 263 10*3/uL (ref 150–400)
RBC: 3.19 MIL/uL — AB (ref 3.87–5.11)
RDW: 14.6 % (ref 11.5–15.5)
WBC: 12.5 10*3/uL — AB (ref 4.0–10.5)

## 2016-03-28 NOTE — Progress Notes (Signed)
Post Partum Day 1 Subjective: no complaints, up ad lib, voiding, tolerating PO, + flatus and trying to breast feed, baby having some difficulty with latch  Objective: Blood pressure 105/64, pulse 76, temperature 97.7 F (36.5 C), temperature source Oral, resp. rate 20, height 5\' 6"  (1.676 m), weight 96.616 kg (213 lb), SpO2 98 %, unknown if currently breastfeeding.  Physical Exam:  General: alert, cooperative and no distress Lochia: appropriate Uterine Fundus: firm perineum: healing well DVT Evaluation: No evidence of DVT seen on physical exam. Negative Homan's sign. No cords or calf tenderness.   Recent Labs  03/27/16 0404 03/28/16 0538  HGB 11.2* 10.2*  HCT 32.0* 29.3*    Assessment/Plan: Plan for discharge tomorrow, Breastfeeding, Lactation consult and Circumcision in office    LOS: 1 day   Yosselyn Tax STACIA 03/28/2016, 10:26 AM

## 2016-03-28 NOTE — Lactation Note (Addendum)
This note was copied from a baby's chart. Lactation Consultation Note  Mother can easily express flow of colostrum. Baby is tongue thrusting.  Reviewed suck training w/ mother. Mother had short shaft nipples that evert with stimulation. Had mother prepump w/ hand pump before latching. Attempted latching in all positions with and without #20NS. Baby latches and then pushes off with his tongue off and on throughout feeding. Mother recently pumped 5 ml of colostrum. Demonstrated how to finger syringe feed baby. Pacifier use not recommended at this time.  Encouraged mother to continue putting baby to the breast, do suck training and post pump and give volume back to baby at next feeding until he is able to sustain latch.  Patient Name: Erin Poole ZOXWR'UToday's Date: 03/28/2016 Reason for consult: Follow-up assessment   Maternal Data Has patient been taught Hand Expression?: Yes Does the patient have breastfeeding experience prior to this delivery?: No  Feeding Feeding Type: Breast Fed Length of feed: 10 min (off and on)  LATCH Score/Interventions Latch: Repeated attempts needed to sustain latch, nipple held in mouth throughout feeding, stimulation needed to elicit sucking reflex. Intervention(s): Adjust position;Assist with latch;Breast massage;Breast compression  Audible Swallowing: A few with stimulation Intervention(s): Hand expression  Type of Nipple: Everted at rest and after stimulation (short shaft) Intervention(s): Hand pump  Comfort (Breast/Nipple): Soft / non-tender     Hold (Positioning): Assistance needed to correctly position infant at breast and maintain latch.  LATCH Score: 7  Lactation Tools Discussed/Used Tools: Pump Breast pump type: Double-Electric Breast Pump   Consult Status Consult Status: Follow-up Date: 03/29/16 Follow-up type: In-patient    Dahlia ByesBerkelhammer, Ruth [facility] Center[facility] 03/28/2016, 12:27 PM

## 2016-03-29 NOTE — Lactation Note (Signed)
This note was copied from a baby's chart. Lactation Consultation Note  Patient Name: Erin Poole ONGEX'BToday's Date: 03/29/2016 Reason for consult: Follow-up assessment Baby 45 hours old. Mom reports that she just finished giving baby 20 ml of EBM by bottle. Mom reports that the baby has been pushing the nipple out/tongue thrusting. Enc mom to continue putting the baby to breast with cues, then supplement with EBM, and then post-pump. Enc mom to continue suck training with finger as well. Discussed progression of milk coming to volume, and engorgement prevention and treatment. Referred mom to Baby and Me booklet for number of diapers to expect by day of life and EBM storage guidelines. Mom aware of OP/BFSG and LC phone line assistance after D/C. Mom has personal DEBP at home.   Maternal Data    Feeding Feeding Type: Breast Milk  LATCH Score/Interventions                      Lactation Tools Discussed/Used     Consult Status Consult Status: PRN    Geralynn OchsWILLIARD, Vernette Moise 03/29/2016, 11:28 AM

## 2016-03-29 NOTE — Progress Notes (Signed)
PPD#2 Pt without complaints. Ready for discharge VSSAF IMP/ Doing well.  Plan/ Discharge.

## 2016-03-29 NOTE — Discharge Summary (Signed)
Obstetric Discharge Summary Reason for Admission: rupture of membranes Prenatal Procedures: ultrasound Intrapartum Procedures: spontaneous vaginal delivery Postpartum Procedures: none Complications-Operative and Postpartum: none HEMOGLOBIN  Date Value Ref Range Status  03/28/2016 10.2* 12.0 - 15.0 g/dL Final   HCT  Date Value Ref Range Status  03/28/2016 29.3* 36.0 - 46.0 % Final    Physical Exam:  General: alert Lochia: appropriate Uterine Fundus: firm   Discharge Diagnoses: Term Pregnancy-delivered  Discharge Information: Date: 03/29/2016 Activity: pelvic rest Diet: routine Medications: PNV and Ibuprofen Condition: stable Instructions: refer to practice specific booklet Discharge to: home Follow-up Information    Follow up with CALLAHAN, SIDNEY, DO In 1 month.   Specialty:  Obstetrics and Gynecology   Contact information:   [address] [place] [id number] [phone]       Newborn Data: Live born female  Birth Weight: 8 lb 11.5 oz (3955 g) APGAR: 9, 9  Home with mother.  ANDERSON,MARK E 03/29/2016, 8:49 AM
